# Patient Record
Sex: Female | Born: 1955 | Race: White | Hispanic: No | Marital: Married | Smoking: Never smoker
Health system: Southern US, Community
[De-identification: ages and names within clinical notes are randomized; demographics above are authoritative.]

## PROBLEM LIST (undated history)

## (undated) DIAGNOSIS — I10 Essential (primary) hypertension: Secondary | ICD-10-CM

## (undated) DIAGNOSIS — F32A Depression, unspecified: Secondary | ICD-10-CM

## (undated) DIAGNOSIS — K219 Gastro-esophageal reflux disease without esophagitis: Secondary | ICD-10-CM

## (undated) DIAGNOSIS — F329 Major depressive disorder, single episode, unspecified: Secondary | ICD-10-CM

## (undated) HISTORY — PX: TONSILLECTOMY: SUR1361

---

## 2006-02-25 ENCOUNTER — Other Ambulatory Visit: Admission: RE | Admit: 2006-02-25 | Discharge: 2006-02-25 | Payer: Self-pay | Admitting: Obstetrics and Gynecology

## 2006-09-02 ENCOUNTER — Encounter: Admission: RE | Admit: 2006-09-02 | Discharge: 2006-09-02 | Payer: Self-pay | Admitting: Family Medicine

## 2011-06-01 ENCOUNTER — Emergency Department (INDEPENDENT_AMBULATORY_CARE_PROVIDER_SITE_OTHER): Payer: Commercial Managed Care - PPO

## 2011-06-01 ENCOUNTER — Emergency Department (HOSPITAL_BASED_OUTPATIENT_CLINIC_OR_DEPARTMENT_OTHER)
Admission: EM | Admit: 2011-06-01 | Discharge: 2011-06-01 | Disposition: A | Discharge status: Home / Self Care | Payer: Commercial Managed Care - PPO | Attending: Emergency Medicine | Admitting: Emergency Medicine

## 2011-06-01 ENCOUNTER — Encounter: Payer: Self-pay | Admitting: *Deleted

## 2011-06-01 DIAGNOSIS — R079 Chest pain, unspecified: Secondary | ICD-10-CM

## 2011-06-01 DIAGNOSIS — R0602 Shortness of breath: Secondary | ICD-10-CM | POA: Insufficient documentation

## 2011-06-01 DIAGNOSIS — I10 Essential (primary) hypertension: Secondary | ICD-10-CM | POA: Insufficient documentation

## 2011-06-01 HISTORY — DX: Depression, unspecified: F32.A

## 2011-06-01 HISTORY — DX: Gastro-esophageal reflux disease without esophagitis: K21.9

## 2011-06-01 HISTORY — DX: Major depressive disorder, single episode, unspecified: F32.9

## 2011-06-01 HISTORY — DX: Essential (primary) hypertension: I10

## 2011-06-01 LAB — DIFFERENTIAL
Basophils Absolute: 0 10*3/uL (ref 0.0–0.1)
Eosinophils Relative: 0 % (ref 0–5)
Lymphocytes Relative: 11 % — ABNORMAL LOW (ref 12–46)
Lymphs Abs: 1.7 10*3/uL (ref 0.7–4.0)
Monocytes Absolute: 1.1 10*3/uL — ABNORMAL HIGH (ref 0.1–1.0)
Neutro Abs: 12.1 10*3/uL — ABNORMAL HIGH (ref 1.7–7.7)

## 2011-06-01 LAB — BASIC METABOLIC PANEL
CO2: 27 mEq/L (ref 19–32)
Calcium: 10.1 mg/dL (ref 8.4–10.5)
Chloride: 94 mEq/L — ABNORMAL LOW (ref 96–112)
Creatinine, Ser: 0.6 mg/dL (ref 0.50–1.10)
Glucose, Bld: 122 mg/dL — ABNORMAL HIGH (ref 70–99)

## 2011-06-01 LAB — CBC
HCT: 38.6 % (ref 36.0–46.0)
Hemoglobin: 13.4 g/dL (ref 12.0–15.0)
MCV: 88.5 fL (ref 78.0–100.0)
RBC: 4.36 MIL/uL (ref 3.87–5.11)
RDW: 12.8 % (ref 11.5–15.5)
WBC: 14.9 10*3/uL — ABNORMAL HIGH (ref 4.0–10.5)

## 2011-06-01 LAB — CARDIAC PANEL(CRET KIN+CKTOT+MB+TROPI)
CK, MB: 1.6 ng/mL (ref 0.3–4.0)
CK, MB: 1.5 ng/mL (ref 0.3–4.0)
Relative Index: INVALID (ref 0.0–2.5)
Total CK: 66 U/L (ref 7–177)
Total CK: 60 U/L (ref 7–177)

## 2011-06-01 NOTE — ED Provider Notes (Signed)
History     Chief Complaint  Patient presents with  . Chest Pain   HPI  Pt reports about a month of intermittent sharp/pressure pain in mid chest. Occasionally radiating to neck or back and associated with SOB at times. No particular provoking or relieving factors. Specifically not worse with exertion. She has been seen by PCP and referred to [facility] for further eval. She has an appointment with them in about 2-3 weeks. She has no known CAD. She was having palpitations this morning and so decided to come to the ED for eval.   Past Medical History  Diagnosis Date  . Hypertension   . Depression   . GERD (gastroesophageal reflux disease)     Past Surgical History  Procedure Date  . Tonsillectomy     No family history on file.  History  Substance Use Topics  . Smoking status: Never Smoker   . Smokeless tobacco: Not on file  . Alcohol Use: Yes     occasional    OB History    Grav Para Term Preterm Abortions TAB SAB Ect Mult Living                  Review of Systems  Constitutional: Negative for fever.  HENT: Negative for congestion and sore throat.   Respiratory: Positive for shortness of breath. Negative for cough.   Cardiovascular: Positive for chest pain and palpitations.  Gastrointestinal: Negative for vomiting and abdominal pain.  Genitourinary: Negative for dysuria.  Musculoskeletal: Negative for myalgias.  Skin: Negative for rash.  Neurological: Negative for headaches.    Physical Exam  BP 195/98  Pulse 81  Temp(Src) 98.3 F (36.8 C) (Oral)  Resp 19  SpO2 98%  Physical Exam  Constitutional: She is oriented to person, place, and time. She appears well-developed and well-nourished.  HENT:  Head: Normocephalic and atraumatic.  Eyes: EOM are normal. Pupils are equal, round, and reactive to light.  Neck: Normal range of motion.  Cardiovascular: Normal rate and regular rhythm.   Pulmonary/Chest: Effort normal and breath sounds normal. She has no wheezes. She  exhibits tenderness.  Abdominal: Soft. Bowel sounds are normal. She exhibits no distension. There is no tenderness.  Musculoskeletal: Normal range of motion. She exhibits no edema.  Neurological: She is alert and oriented to person, place, and time.  Skin: Skin is warm and dry. No rash noted.  Psychiatric: She has a normal mood and affect.    ED Course  Procedures  MDM  Date: 06/01/2011  Rate: 80  Rhythm: normal sinus rhythm  QRS Axis: normal  Intervals: normal  ST/T Wave abnormalities: normal  Conduction Disutrbances:none  Narrative Interpretation:   Old EKG Reviewed: none available  Labs and imaging unremarkable. No concern for ACS. TIMI score is 1. Advised to keep scheduled appt with [facility].       Charles B. Bernette Mayers, MD 06/01/11 [phone]

## 2011-06-01 NOTE — ED Notes (Signed)
C/o chest pain for about 1.5 months, has become more constant, worse, and feels some palpitations. Has had some SOB associated with her pain

## 2022-04-13 ENCOUNTER — Emergency Department (HOSPITAL_BASED_OUTPATIENT_CLINIC_OR_DEPARTMENT_OTHER): Payer: PRIVATE HEALTH INSURANCE

## 2022-04-13 ENCOUNTER — Encounter (HOSPITAL_BASED_OUTPATIENT_CLINIC_OR_DEPARTMENT_OTHER): Payer: Self-pay

## 2022-04-13 ENCOUNTER — Emergency Department (HOSPITAL_BASED_OUTPATIENT_CLINIC_OR_DEPARTMENT_OTHER)
Admission: EM | Admit: 2022-04-13 | Discharge: 2022-04-13 | Disposition: A | Discharge status: Home / Self Care | Payer: PRIVATE HEALTH INSURANCE | Attending: Emergency Medicine | Admitting: Emergency Medicine

## 2022-04-13 ENCOUNTER — Other Ambulatory Visit: Payer: Self-pay

## 2022-04-13 DIAGNOSIS — Z79899 Other long term (current) drug therapy: Secondary | ICD-10-CM | POA: Insufficient documentation

## 2022-04-13 DIAGNOSIS — I10 Essential (primary) hypertension: Secondary | ICD-10-CM | POA: Insufficient documentation

## 2022-04-13 DIAGNOSIS — R000 Tachycardia, unspecified: Secondary | ICD-10-CM | POA: Insufficient documentation

## 2022-04-13 DIAGNOSIS — Z7982 Long term (current) use of aspirin: Secondary | ICD-10-CM | POA: Insufficient documentation

## 2022-04-13 DIAGNOSIS — R002 Palpitations: Secondary | ICD-10-CM | POA: Diagnosis present

## 2022-04-13 DIAGNOSIS — R42 Dizziness and giddiness: Secondary | ICD-10-CM | POA: Insufficient documentation

## 2022-04-13 LAB — CBC WITH DIFFERENTIAL/PLATELET
Abs Immature Granulocytes: 0.02 10*3/uL (ref 0.00–0.07)
Basophils Absolute: 0 10*3/uL (ref 0.0–0.1)
Basophils Relative: 1 %
Eosinophils Absolute: 0.1 10*3/uL (ref 0.0–0.5)
Eosinophils Relative: 1 %
HCT: 42.7 % (ref 36.0–46.0)
Hemoglobin: 14.6 g/dL (ref 12.0–15.0)
Immature Granulocytes: 0 %
Lymphocytes Relative: 25 %
Lymphs Abs: 2.1 10*3/uL (ref 0.7–4.0)
MCH: 31.3 pg (ref 26.0–34.0)
MCHC: 34.2 g/dL (ref 30.0–36.0)
MCV: 91.4 fL (ref 80.0–100.0)
Monocytes Absolute: 0.7 10*3/uL (ref 0.1–1.0)
Monocytes Relative: 8 %
Neutro Abs: 5.3 10*3/uL (ref 1.7–7.7)
Neutrophils Relative %: 65 %
Platelets: 345 10*3/uL (ref 150–400)
RBC: 4.67 MIL/uL (ref 3.87–5.11)
RDW: 13.1 % (ref 11.5–15.5)
WBC: 8.2 10*3/uL (ref 4.0–10.5)
nRBC: 0 % (ref 0.0–0.2)

## 2022-04-13 LAB — COMPREHENSIVE METABOLIC PANEL
ALT: 17 U/L (ref 0–44)
AST: 19 U/L (ref 15–41)
Albumin: 4 g/dL (ref 3.5–5.0)
Alkaline Phosphatase: 65 U/L (ref 38–126)
Anion gap: 8 (ref 5–15)
BUN: 11 mg/dL (ref 8–23)
CO2: 26 mmol/L (ref 22–32)
Calcium: 9.5 mg/dL (ref 8.9–10.3)
Chloride: 104 mmol/L (ref 98–111)
Creatinine, Ser: 0.76 mg/dL (ref 0.44–1.00)
GFR, Estimated: >60 mL/min (ref >60)
Glucose, Bld: 155 mg/dL — ABNORMAL HIGH (ref 70–99)
Potassium: 4.6 mmol/L (ref 3.5–5.1)
Sodium: 138 mmol/L (ref 135–145)
Total Bilirubin: 0.5 mg/dL (ref 0.3–1.2)
Total Protein: 7.2 g/dL (ref 6.5–8.1)

## 2022-04-13 LAB — MAGNESIUM: Magnesium: 2.1 mg/dL (ref 1.7–2.4)

## 2022-04-13 LAB — TSH: TSH: 1.031 u[IU]/mL (ref 0.350–4.500)

## 2022-04-13 LAB — D-DIMER, QUANTITATIVE: D-Dimer, Quant: <0.27 ug/mL-FEU (ref 0.00–0.50)

## 2022-04-13 NOTE — ED Notes (Signed)
ED Provider at bedside. 

## 2022-04-13 NOTE — ED Provider Notes (Signed)
[place] EMERGENCY DEPARTMENT Provider Note   CSN: [id number] Arrival date & time: 04/13/22  1112     History  Chief Complaint  Patient presents with   Palpitations    Mariah Vargas is a 66 y.o. female with a past medical history of hypertension, GERD presenting to the ED with a chief complaint of palpitations.  She noticed on 04/09/22 that she was experiencing palpitations with associated lightheadedness.  This lasted for several minutes and then resolved on its own.  She had similar symptoms throughout last night as well.  She denies history of similar symptoms in the past.  She did not experience any prior or current chest pain, headache, blurry vision, numbness in arms or legs or recent vomiting, diarrhea or fever.  No leg swelling.  She was never told that she had an irregular heartbeat or problems with her thyroid.  She saw a cardiologist about 10 years ago and was cleared from their standpoint after reassuring work-up.  No recent changes to her medications or lifestyle.   Palpitations Associated symptoms: no chest pain, no cough, no dizziness, no nausea, no shortness of breath, no vomiting and no weakness       Home Medications Prior to Admission medications   Medication Sig Start Date End Date Taking? Authorizing Provider  Multiple Vitamin (MULTIVITAMIN) tablet Take 1 tablet by mouth daily.     Yes [provider]  omeprazole (PRILOSEC OTC) 20 MG tablet Take 20 mg by mouth daily.     Yes [provider]  propranolol (INDERAL) 10 MG tablet Take 10 mg by mouth 3 (three) times daily.     Yes [provider]  aspirin 325 MG EC tablet Take 325 mg by mouth once.     [provider]  FLUoxetine (PROZAC) 20 MG capsule Take 60 mg by mouth daily.      [provider]  sertraline (ZOLOFT) 100 MG tablet Take 200 mg by mouth daily. 01/04/22   [provider]      Allergies    Patient has no known allergies.    Review of  Systems   Review of Systems  Constitutional:  Positive for fatigue. Negative for appetite change, chills and fever.  HENT:  Negative for ear pain, rhinorrhea, sneezing and sore throat.   Eyes:  Negative for photophobia and visual disturbance.  Respiratory:  Negative for cough, chest tightness, shortness of breath and wheezing.   Cardiovascular:  Positive for palpitations. Negative for chest pain.  Gastrointestinal:  Negative for abdominal pain, blood in stool, constipation, diarrhea, nausea and vomiting.  Genitourinary:  Negative for dysuria, hematuria and urgency.  Musculoskeletal:  Negative for myalgias.  Skin:  Negative for rash.  Neurological:  Positive for light-headedness. Negative for dizziness and weakness.   Physical Exam Updated Vital Signs BP 135/76   Pulse 85   Temp 98.4 F (36.9 C) (Oral)   Resp 16   Ht 5\' 4"  (1.626 m)   Wt 45.4 kg   SpO2 100%   BMI 17.16 kg/m  Physical Exam Vitals and nursing note reviewed.  Constitutional:      General: She is not in acute distress.    Appearance: She is well-developed.     Comments: Speaking complete sentences without difficulty.  No signs of distress  HENT:     Head: Normocephalic and atraumatic.     Nose: Nose normal.  Eyes:     General: No scleral icterus.       Right  eye: No discharge.        Left eye: No discharge.     Conjunctiva/sclera: Conjunctivae normal.  Cardiovascular:     Rate and Rhythm: Normal rate and regular rhythm.     Heart sounds: Normal heart sounds. No murmur heard.   No friction rub. No gallop.  Pulmonary:     Effort: Pulmonary effort is normal. No respiratory distress.     Breath sounds: Normal breath sounds.  Abdominal:     General: Bowel sounds are normal. There is no distension.     Palpations: Abdomen is soft.     Tenderness: There is no abdominal tenderness. There is no guarding.  Musculoskeletal:        General: No tenderness. Normal range of motion.     Cervical back: Normal range of  motion and neck supple.  Skin:    General: Skin is warm and dry.     Findings: No rash.  Neurological:     Mental Status: She is alert and oriented to person, place, and time.     Cranial Nerves: No cranial nerve deficit.     Sensory: No sensory deficit.     Motor: No weakness or abnormal muscle tone.     Coordination: Coordination normal.    ED Results / Procedures / Treatments   Labs (all labs ordered are listed, but only abnormal results are displayed) Labs Reviewed  COMPREHENSIVE METABOLIC PANEL - Abnormal; Notable for the following components:      Result Value   Glucose, Bld 155 (*)    All other components within normal limits  CBC WITH DIFFERENTIAL/PLATELET  MAGNESIUM  D-DIMER, QUANTITATIVE  TSH    EKG EKG Interpretation  Date/Time:  Tuesday April 13 2022 11:25:12 EDT Ventricular Rate:  103 PR Interval:  159 QRS Duration: 92 QT Interval:  334 QTC Calculation: 438 R Axis:   47 Text Interpretation: Sinus tachycardia Right atrial enlargement Since prior ECG, rate has increased Confirmed by Gareth Morgan [phone]) on 04/13/2022 11:54:42 AM  Radiology DG Chest 2 View  Result Date: 04/13/2022 CLINICAL DATA:  Cardiac palpitations over the last several days. Dizziness. EXAM: CHEST - 2 VIEW COMPARISON:  06/01/2011 FINDINGS: The heart size and mediastinal contours are within normal limits. Both lungs are clear. The visualized skeletal structures are unremarkable. IMPRESSION: No active cardiopulmonary disease. Electronically Signed   By: Van Clines M.D.   On: 04/13/2022 11:56    Procedures Procedures    Medications Ordered in ED Medications - No data to display  ED Course/ Medical Decision Making/ A&P                           Medical Decision Making Amount and/or Complexity of Data Reviewed Labs: ordered. Radiology: ordered.   66 year old female presenting to the ED with a chief complaint of palpitations, lightheadedness.  Symptoms began approximately 4  days ago then improved without intervention.  Again recurred throughout last night.  Denies any chest pain, headache, blurry vision.  On exam patient without any neurological deficits.  Bedside monitor shows heart rate will increase to low 100s and then decrease back to 80s to 90s.  No lower extremity edema, erythema or calf tenderness of concern me for DVT.  Lungs are clear bilaterally.  We will initiate work-up.  EKG shows sinus tachycardia, no ischemic changes, no STEMI.  CMP, CBC unremarkable.  Magnesium is normal here.  D-dimer is negative here.  TSH is pending.  Patient  remains asymptomatic during ED visit.  She has heart rate maintained in the 90s and appears regular. I do not feel that her symptoms are caused by PE, arrhythmia, or neurological cause based on her reassuring work-up and physical exam findings. Will have her follow up on TSH and follow up with PCP as well.  I also feel that she will benefit from following up with cardiology due to her age and symptoms as she may need long-term monitoring.  She remains hemodynamically stable at this time.  She is no longer tachycardic and is not hypoxic.  Patient is comfortable with discharge home.  Return precautions given   Patient is hemodynamically stable, in NAD, and able to ambulate in the ED. Evaluation does not show pathology that would require ongoing emergent intervention or inpatient treatment. I explained the diagnosis to the patient. Pain has been managed and has no complaints prior to discharge. Patient is comfortable with above plan and is stable for discharge at this time. All questions were answered prior to disposition. Strict return precautions for returning to the ED were discussed. Encouraged follow up with PCP.   An After Visit Summary was printed and given to the patient.   Portions of this note were generated with [employer]. Dictation errors may occur despite best attempts at proofreading.         Final  Clinical Impression(s) / ED Diagnoses Final diagnoses:  Palpitations  Sinus tachycardia    Rx / DC Orders ED Discharge Orders     None         Delia Heady, PA-C 04/13/22 1305    Gareth Morgan, MD 04/13/22 2213

## 2022-04-13 NOTE — Discharge Instructions (Signed)
Follow-up with your primary care provider and the cardiologist listed below. Continue home medications as previously prescribed. Return to the ER if you start to experience worsening symptoms, chest pain, shortness of breath, severe headache, blurry vision.

## 2022-04-13 NOTE — ED Notes (Addendum)
Presents with having int heart palpitations. Currently does not feel her heart beating irregular. Denies any nausea, vomiting, diarrhea, no changes in medication or beginning new medication, denies any extremity numbness. Does have dizziness intermittently, denies any pain

## 2022-04-13 NOTE — ED Notes (Signed)
At DC time, VSS, NSR without ectopy noted on monitor, Resp status WNL. Denies any chest pain or discomfort at time of DC

## 2022-04-13 NOTE — ED Notes (Signed)
AVS reviewed with client, opportunity for questions provided about care, test, exam etc while here at [facility] ED. Also encouraged client to call to make an appt with Cardiology as recommended by the ED MD and PA.

## 2022-04-13 NOTE — ED Triage Notes (Signed)
Pt reports feeling heart palpitations beginning Friday evening, resolved then returned last night.  Denies chest pain.  Pt reports dizziness during episodes.  Denies hx afib or irregular heart beat.  Denies fever or recent illness.  Hx HTN, depression.

## 2022-12-21 IMAGING — CR DG CHEST 2V
2 series · 2 of 2 positions shown · non-contrast
Comparison: 06/01/2011

CLINICAL DATA: Cardiac palpitations over the last several days.
Dizziness.

EXAM:
CHEST - 2 VIEW

[w chest pa]
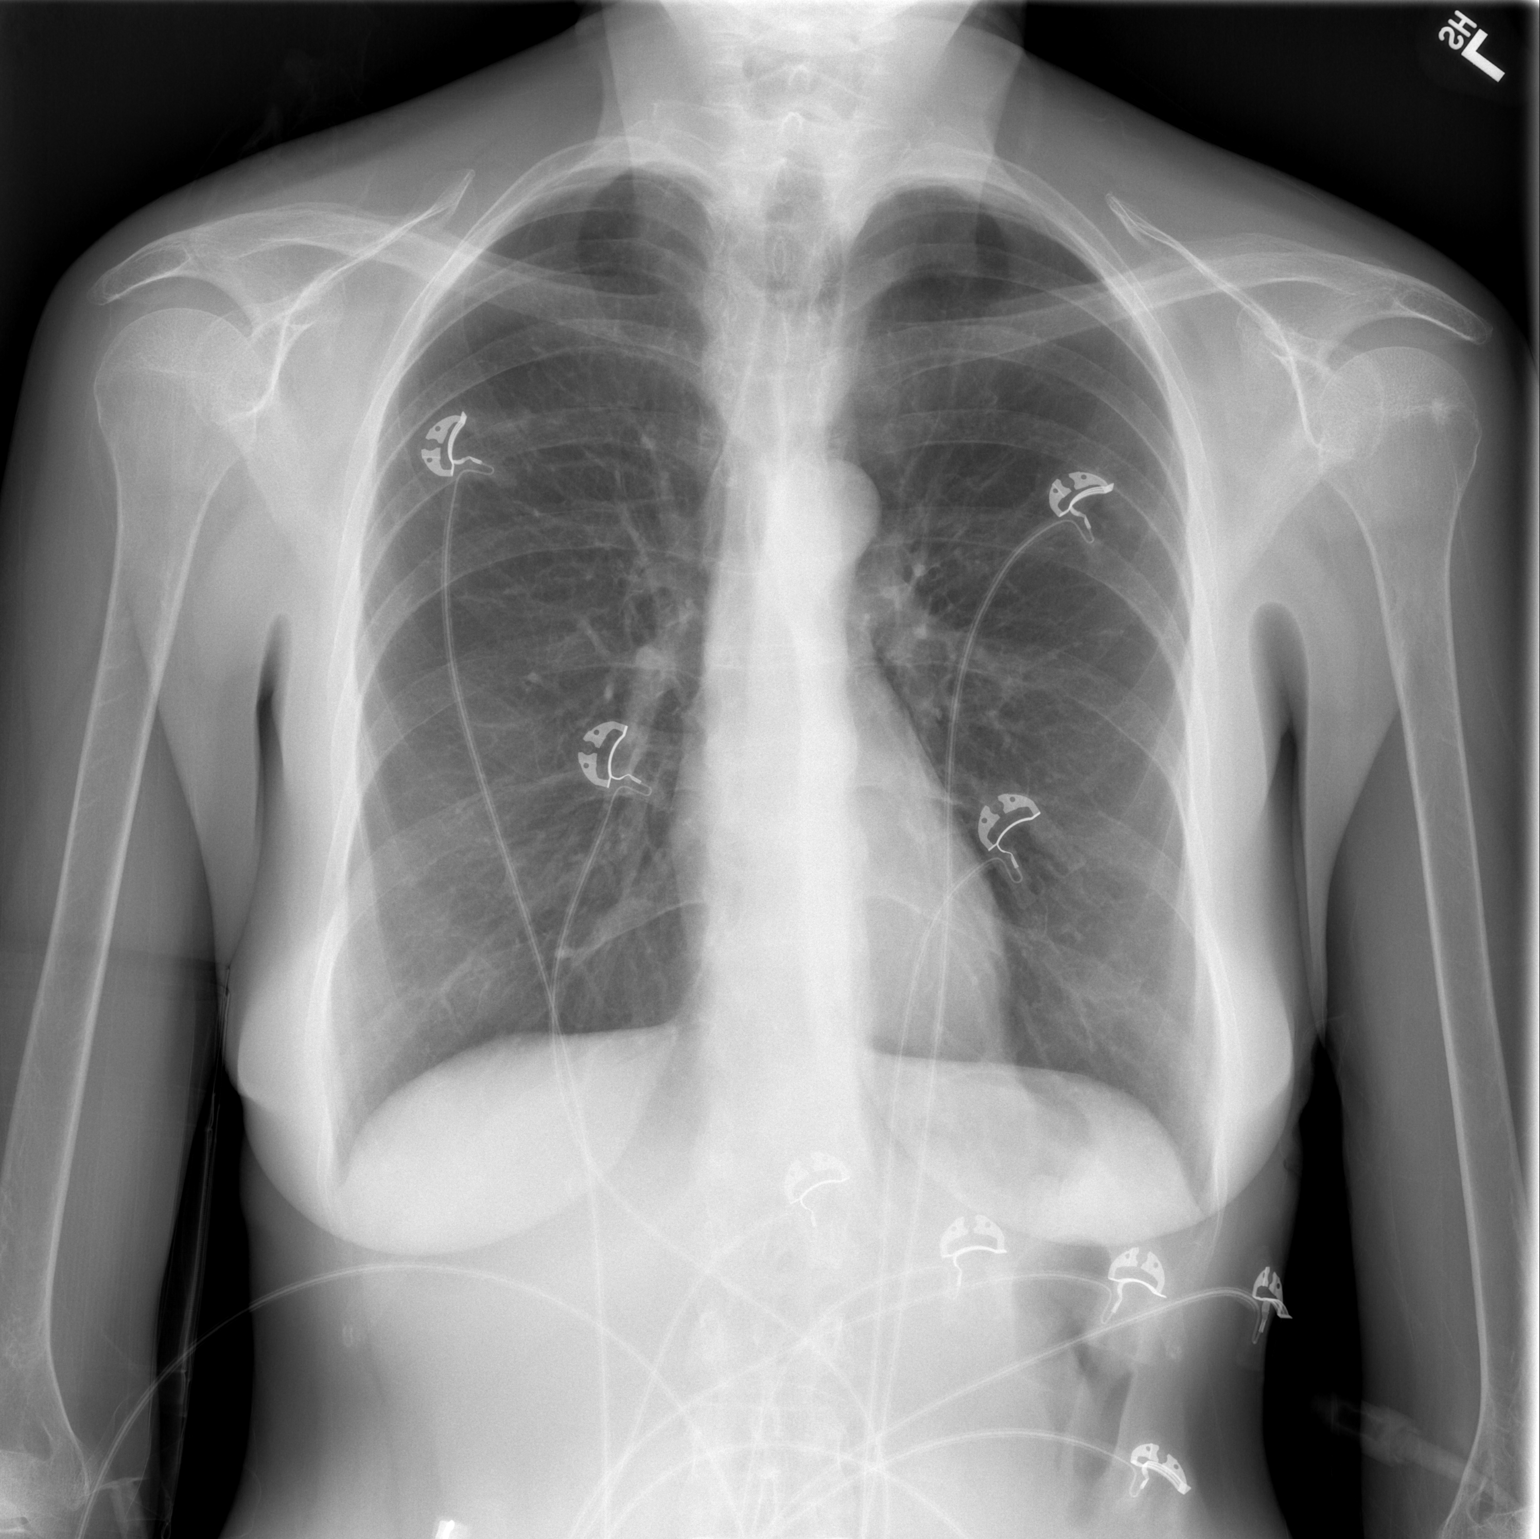

[w chest lat]
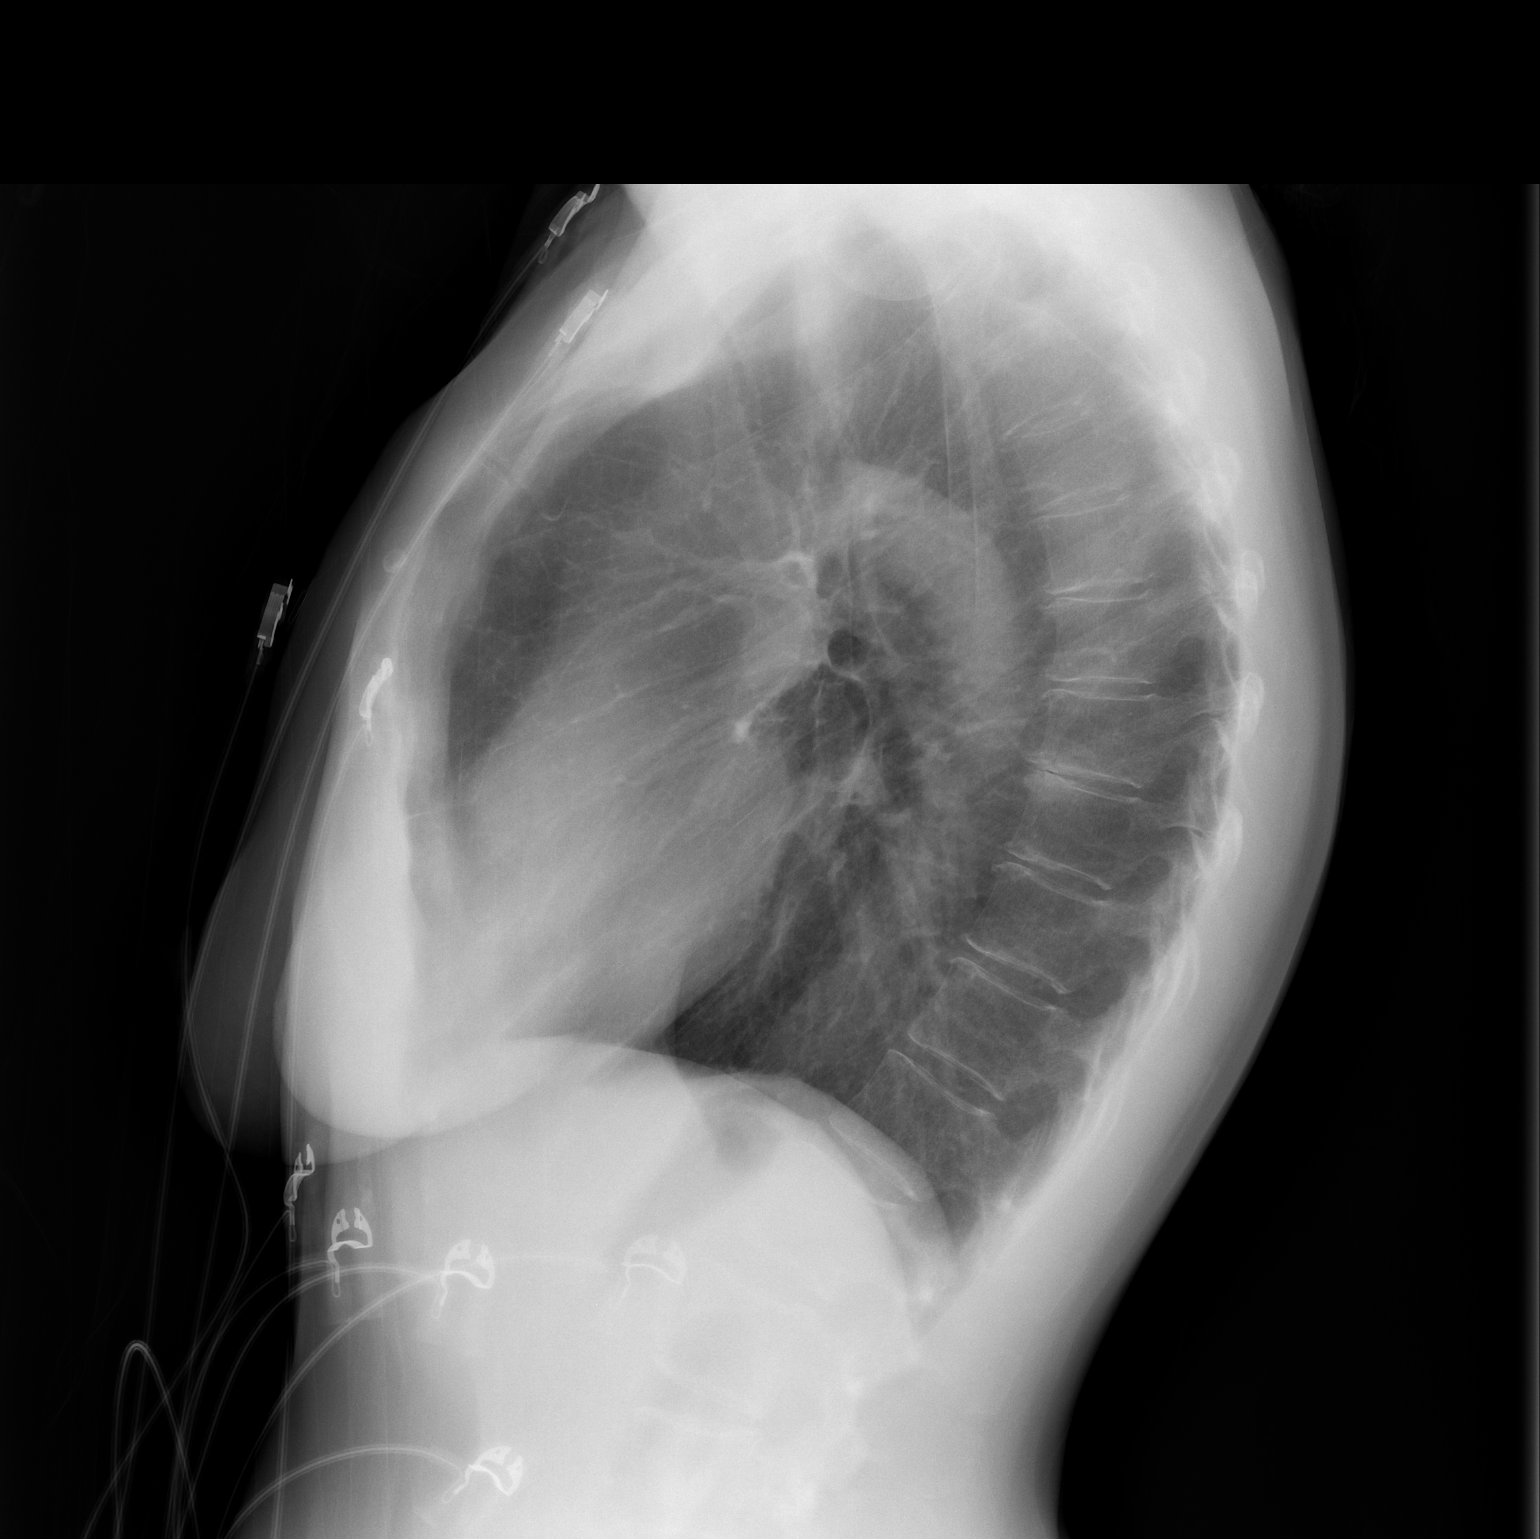

[2 of 2 positions shown; findings below may reference images not displayed]

FINDINGS: The heart size and mediastinal contours are within normal limits.
Both lungs are clear. The visualized skeletal structures are
unremarkable.
IMPRESSION: No active cardiopulmonary disease.

## 2023-03-21 ENCOUNTER — Encounter: Payer: Self-pay | Admitting: *Deleted

## 2023-08-09 ENCOUNTER — Other Ambulatory Visit: Payer: Self-pay | Admitting: *Deleted

## 2023-08-09 DIAGNOSIS — I8392 Asymptomatic varicose veins of left lower extremity: Secondary | ICD-10-CM

## 2023-08-22 ENCOUNTER — Ambulatory Visit (HOSPITAL_COMMUNITY)
Admission: RE | Admit: 2023-08-22 | Discharge: 2023-08-22 | Disposition: A | Discharge status: Home / Self Care | Payer: Medicare Other | Source: Ambulatory Visit | Attending: Vascular Surgery | Admitting: Vascular Surgery

## 2023-08-22 ENCOUNTER — Ambulatory Visit (INDEPENDENT_AMBULATORY_CARE_PROVIDER_SITE_OTHER): Payer: Medicare Other | Admitting: Physician Assistant

## 2023-08-22 ENCOUNTER — Encounter: Payer: Self-pay | Admitting: Physician Assistant

## 2023-08-22 VITALS — BP 161/77 | HR 68 | Temp 97.1°F | Resp 180 | Ht 64.0 in | Wt 105.8 lb

## 2023-08-22 DIAGNOSIS — I8392 Asymptomatic varicose veins of left lower extremity: Secondary | ICD-10-CM

## 2023-08-22 DIAGNOSIS — M7989 Other specified soft tissue disorders: Secondary | ICD-10-CM

## 2023-08-22 NOTE — Progress Notes (Signed)
[facility]           [place]  History and Physical   Mariah Vargas is a 67 y.o. female who presents with left leg varicosity.   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  She states that she has some aching in the left lower leg varicose vein.  Otherwise, she does not have any swelling.  She does not have hx of DVT or family hx of varicose veins.  She has not tried to wear compression.  She does elevate her legs, which does give her some relief.  She states that when she does work, she is sitting most of the time.   The pt is not on a statin for cholesterol management.  The pt is on a daily aspirin.   Other AC:  none The pt is  on BB The pt is not on medication for diabetes.   Tobacco hx:  never  Pt does not have family hx of AAA.  Past Medical History:  Diagnosis Date   Depression    GERD (gastroesophageal reflux disease)    Hypertension     Past Surgical History:  Procedure Laterality Date   TONSILLECTOMY      Social History   Socioeconomic History   Marital status: Married    Spouse name: Not on file   Number of children: Not on file   Years of education: Not on file   Highest education level: Not on file  Occupational History   Not on file  Tobacco Use   Smoking status: Never   Smokeless tobacco: Not on file  Substance and Sexual Activity   Alcohol use: Yes    Comment: occasional   Drug use: No   Sexual activity: Not on file  Other Topics Concern   Not on file  Social History Narrative   Not on file   Social Determinants of Health   Financial Resource Strain: Not on file  Food Insecurity: No Food Insecurity (06/08/2022)   Received from [facility], [facility]   Hunger Vital Sign    Worried About Running Out of Food in the Last Year: Never true    Ran Out of Food in the Last Year: Never true  Transportation Needs: Not on file  Physical Activity: Not on file  Stress:  Not on file  Social Connections: Unknown (03/15/2022)   Received from [facility], [facility]   Social Network    Social Network: Not on file  Intimate Partner Violence: Unknown (02/09/2022)   Received from [facility], [facility]   HITS    Physically Hurt: Not on file    Insult or Talk Down To: Not on file    Threaten Physical Harm: Not on file    Scream or Curse: Not on file    Family Hx:  negative for varicose veins and AAA  Current Outpatient Medications  Medication Sig Dispense Refill   aspirin 325 MG EC tablet Take 325 mg by mouth once.      FLUoxetine (PROZAC) 20 MG capsule Take 60 mg by mouth daily.       Multiple Vitamin (MULTIVITAMIN) tablet Take 1 tablet by mouth daily.       omeprazole (PRILOSEC OTC) 20 MG tablet Take 20 mg by mouth daily.       propranolol (INDERAL) 10 MG tablet Take 10 mg by mouth 3 (three)  times daily.       sertraline (ZOLOFT) 100 MG tablet Take 200 mg by mouth daily.     No current facility-administered medications for this visit.    No Known Allergies  REVIEW OF SYSTEMS:   [X]  denotes positive finding, [ ]  denotes negative finding Cardiac  Comments:  Chest pain or chest pressure:    Shortness of breath upon exertion:    Short of breath when lying flat:    Irregular heart rhythm:        Vascular    Pain in calf, thigh, or hip brought on by ambulation:    Pain in feet at night that wakes you up from your sleep:     Blood clot in your veins:    Leg swelling:         Pulmonary    Oxygen at home:    Productive cough:     Wheezing:         Neurologic    Sudden weakness in arms or legs:     Sudden numbness in arms or legs:     Sudden onset of difficulty speaking or slurred speech:    Temporary loss of vision in one eye:     Problems with dizziness:         Gastrointestinal    Blood in stool:     Vomited blood:         Genitourinary    Burning when urinating:     Blood in urine:        Psychiatric    Major depression:          Hematologic    Bleeding problems:    Problems with blood clotting too easily:        Skin    Rashes or ulcers:        Constitutional    Fever or chills:      PHYSICAL EXAMINATION:  Today's Vitals   08/22/23 1439 08/22/23 1441  BP:  (!) 161/77  Pulse: 68   Resp: (!) 180   Temp: (!) 97.1 F (36.2 C)   SpO2: 96%   Weight: 105 lb 12.8 oz (48 kg)   Height: 5\' 4"  (1.626 m)   PainSc: 0-No pain 0-No pain   Body mass index is 18.16 kg/m.   General:  WDWN in NAD; vital signs documented above Gait: Not observed HENT: WNL, normocephalic Pulmonary: normal non-labored breathing without wheezing Cardiac: regular HR; without carotid bruits Abdomen: soft, NT, aortic pulse is not palpable Skin: without rashes Vascular Exam/Pulses:  Right Left  Radial 2+ (normal) 2+ (normal)  DP 2+ (normal) 2+ (normal)   Extremities: mild pitting edema bilateral lower extremities from socks; varicosity present on the LLE on the calf  Neurologic: A&O X 3;  moving all extremities equally Psychiatric:  The pt has Normal affect.   Non-Invasive Vascular Imaging:   Venous duplex on 08/22/2023: +--------------+---------+------+-----------+------------+--------+  LEFT         Reflux NoRefluxReflux TimeDiameter cmsComments                          Yes                                   +--------------+---------+------+-----------+------------+--------+  CFV                    yes   >1  second                       +--------------+---------+------+-----------+------------+--------+  FV prox                 yes   >1 second                       +--------------+---------+------+-----------+------------+--------+  FV mid        no                                              +--------------+---------+------+-----------+------------+--------+  FV dist       no                                               +--------------+---------+------+-----------+------------+--------+  Popliteal    no                                              +--------------+---------+------+-----------+------------+--------+  GSV at SFJ              yes    >500 ms     0.565              +--------------+---------+------+-----------+------------+--------+  GSV prox thigh          yes    >500 ms     0.279              +--------------+---------+------+-----------+------------+--------+  GSV mid thigh           yes    >500 ms     0.315              +--------------+---------+------+-----------+------------+--------+  GSV dist thigh          yes    >500 ms      0.31              +--------------+---------+------+-----------+------------+--------+  GSV at knee             yes    >500 ms     0.342              +--------------+---------+------+-----------+------------+--------+  GSV prox calf           yes    >500 ms      0.31              +--------------+---------+------+-----------+------------+--------+  SSV Pop Fossa no                           0.091              +--------------+---------+------+-----------+------------+--------+  SSV prox calf no                           0.123              +--------------+---------+------+-----------+------------+--------+  SSV mid calf  no  0.214              +--------------+---------+------+-----------+------------+--------+   Summary:  Left:  - No evidence of deep vein thrombosis seen in the left lower extremity,  from the common femoral through the popliteal veins.  - No evidence of superficial venous thrombosis in the left lower extremity.     Glorianna Gott is a 67 y.o. female who presents with: varicosity on the LLE     -pt has easily palpable DP pedal pulses bilaterally -pt does not have evidence of DVT.  Pt does have venous reflux in the deep left venous system as well as in  the GSV at the [facility] and in the proximal thigh into the calf.  She is not a candidate for laser ablation as the size of the vein is less than 4mm.   Discussed sclerotherapy but she wanted to try conservative measures with compression and continued leg elevation.   -discussed with pt about wearing knee high 15-20 mmHg compression stockings and pt was measured for these today.    -discussed the importance of leg elevation and how to elevate properly - pt is advised to elevate their legs and a diagram is given to them to demonstrate for pt to lay flat on their back with knees elevated and slightly bent with their feet higher than their knees, which puts their feet higher than their heart for 15 minutes per day.  If pt cannot lay flat, advised to lay as flat as possible.  -pt is advised to continue as much walking as possible and avoid sitting or standing for long periods of time.  Discussed getting up periodically while at work as she does quite a bit of sitting.   -discussed importance of exercise and that water aerobics would also be beneficial.  -handout with recommendations given -pt will f/u as needed.    Doreatha Massed, [facility] [phone]  Clinic MD:  Karin Lieu on call MD
